# Patient Record
Sex: Male | Born: 1995 | Race: White | Hispanic: No | Marital: Single
Health system: Southern US, Community
[De-identification: ages and names within clinical notes are randomized; demographics above are authoritative.]

---

## 2019-10-22 ENCOUNTER — Ambulatory Visit: Payer: Self-pay | Admitting: Allergy and Immunology

## 2019-12-24 ENCOUNTER — Emergency Department (HOSPITAL_COMMUNITY)
Admission: EM | Admit: 2019-12-24 | Discharge: 2019-12-24 | Disposition: A | Discharge status: Home / Self Care | Payer: PRIVATE HEALTH INSURANCE | Attending: Emergency Medicine | Admitting: Emergency Medicine

## 2019-12-24 ENCOUNTER — Encounter (HOSPITAL_COMMUNITY): Payer: Self-pay | Admitting: Emergency Medicine

## 2019-12-24 ENCOUNTER — Other Ambulatory Visit: Payer: Self-pay

## 2019-12-24 ENCOUNTER — Emergency Department (HOSPITAL_COMMUNITY): Payer: PRIVATE HEALTH INSURANCE

## 2019-12-24 DIAGNOSIS — U071 COVID-19: Secondary | ICD-10-CM

## 2019-12-24 DIAGNOSIS — R0602 Shortness of breath: Secondary | ICD-10-CM

## 2019-12-24 DIAGNOSIS — J45909 Unspecified asthma, uncomplicated: Secondary | ICD-10-CM | POA: Diagnosis not present

## 2019-12-24 MED ORDER — BUDESONIDE 90 MCG/ACT IN AEPB: 1.0000 | INHALATION_SPRAY | Freq: Two times a day (BID) | RESPIRATORY_TRACT | 1 refills | Status: AC

## 2019-12-24 NOTE — ED Notes (Signed)
Patient verbalizes understanding of discharge instructions. Opportunity for questioning and answers were provided. Armband removed by staff, pt discharged from ED ambulatory to home.  

## 2019-12-24 NOTE — ED Provider Notes (Signed)
[facility] EMERGENCY DEPARTMENT Provider Note   CSN: [id number] Arrival date & time: 12/24/19  1135     History Chief Complaint  Patient presents with  . COVID+  . Shortness of Breath    Joshua Juarez is a 24 y.o. male.  Patient presents with concerns for hypoxia secondary to COVID-19 infection. Patient does have a history of asthma. But no other significant risk factors. He first became symptomatic with COVID-19 infection on Monday a week ago and on Thursday had a positive test. On Sunday he received monoclonal antibody. He has been seen at several facilities recently. Sunday at [facility] yesterday at [place]. Patient reports low oxygen saturations at home as low as 83% more so in the morning. Patient's main symptoms are shortness of breath and cough. Patient was not vaccinated.        History reviewed. No pertinent past medical history.  There are no problems to display for this patient.        History reviewed. No pertinent family history.  Social History   Tobacco Use  . Smoking status: Not on file  Substance Use Topics  . Alcohol use: Not on file  . Drug use: Not on file    Home Medications Prior to Admission medications   Not on File    Allergies    Patient has no known allergies.  Review of Systems   Review of Systems  Constitutional: Negative for chills and fever.  HENT: Negative for rhinorrhea and sore throat.   Eyes: Negative for visual disturbance.  Respiratory: Positive for cough and shortness of breath.   Cardiovascular: Negative for chest pain and leg swelling.  Gastrointestinal: Negative for abdominal pain, diarrhea, nausea and vomiting.  Genitourinary: Negative for dysuria.  Musculoskeletal: Negative for back pain and neck pain.  Skin: Negative for rash.  Neurological: Negative for dizziness, light-headedness and headaches.  Hematological: Does not bruise/bleed easily.  Psychiatric/Behavioral: Negative for  confusion.    Physical Exam Updated Vital Signs BP (!) 141/88   Pulse 70   Temp 97.9 F (36.6 C) (Oral)   Resp (!) 24   SpO2 96%   Physical Exam Vitals and nursing note reviewed.  Constitutional:      General: He is not in acute distress.    Appearance: Normal appearance. He is well-developed. He is not toxic-appearing or diaphoretic.  HENT:     Head: Normocephalic and atraumatic.  Eyes:     Conjunctiva/sclera: Conjunctivae normal.     Pupils: Pupils are equal, round, and reactive to light.  Cardiovascular:     Rate and Rhythm: Normal rate and regular rhythm.     Heart sounds: No murmur heard.   Pulmonary:     Effort: Pulmonary effort is normal. No respiratory distress.     Breath sounds: Normal breath sounds. No wheezing.  Abdominal:     Palpations: Abdomen is soft.     Tenderness: There is no abdominal tenderness.  Musculoskeletal:        General: Normal range of motion.     Cervical back: Normal range of motion and neck supple.  Skin:    General: Skin is warm and dry.  Neurological:     General: No focal deficit present.     Mental Status: He is alert and oriented to person, place, and time.     Cranial Nerves: No cranial nerve deficit.     Sensory: No sensory deficit.     Motor: No weakness.     ED  Results / Procedures / Treatments   Labs (all labs ordered are listed, but only abnormal results are displayed) Labs Reviewed - No data to display  EKG None  Radiology DG Chest 2 View  Result Date: 12/24/2019 CLINICAL DATA:  Shortness of breath with decreased oxygen saturation. COVID-19 positive EXAM: CHEST - 2 VIEW COMPARISON:  None. FINDINGS: There is ill-defined airspace opacity in both lower lung regions and in the left mid lung. Heart size and pulmonary vascularity are normal. No adenopathy. No bone lesions. IMPRESSION: Areas of patchy airspace opacity in the left mid lung and both lung bases consistent with atypical organism pneumonia. Heart size normal.  No adenopathy. Electronically Signed   By: Bretta Bang III M.D.   On: 12/24/2019 12:47    Procedures Procedures (including critical care time)  Medications Ordered in ED Medications - No data to display  ED Course  I have reviewed the triage vital signs and the nursing notes.  Pertinent labs & imaging results that were available during my care of the patient were reviewed by me and considered in my medical decision making (see chart for details).    MDM Rules/Calculators/A&P                          Patient with known COVID-19 infection. Chest x-ray today showing some early changes consistent with COVID-19 pneumonia. Patient symptoms started last Monday. Is a little over a week. Had positive Covid test on Thursday. On Sunday he received the monoclonal antibody. Past medical history significant for asthma he is continuing to use his albuterol inhaler. Patient states that at home he has been getting pulse ox readings as low as 83%. Patient has been seen at several places over the last few days. On Sunday he was seen at [facility]. Was at [facility] yesterday.  She has saturations here in the room at rest are at the lowest 94% at the highest 99%. With exercise oxygen saturations actually go up. We did walk the patient in the room and oxygen sats got as high as 99%. Patient nontoxic no acute distress.  He does state that he does feel little bit better today. Perhaps the monoclonal antibody is having some benefits.  Feel that patient would be a good candidate for Pulmicort. He can take this along with his albuterol inhaler. Also recommended over-the-counter multivitamin vitamin D and zinc supplements.  Patient given precautions to return for worse breathing problems or increased shortness of breath.   Final Clinical Impression(s) / ED Diagnoses Final diagnoses:  SOB (shortness of breath)  COVID-19    Rx / DC Orders ED Discharge Orders    None       Vanetta Mulders, MD 12/24/19 1619

## 2019-12-24 NOTE — ED Triage Notes (Signed)
Patient arrives to ED with complaints of low saturation and shortness of breath since his COVID dx on 10/7. Pt was seen at [facility] and [facility] this week and d/c from the ED. Pt states he's received COVID outpatient infusion but his O2 saturation in the mornings are in the upper 80's but ambulatory O2 is 93%. Pt states he is still generally weak, SOB, and is worried about his O2 levels.

## 2019-12-24 NOTE — Discharge Instructions (Signed)
Oxygen saturations here today are excellent. Chest x-ray is starting to show some changes of COVID-19 pneumonia which is more of an inflammatory change than an infectious change. Continue use your albuterol inhaler. At least 2 puffs every 6 hours. Start the new medicine Pulmicort 1 puff twice a day for 7 days. Make an appointment to follow-up with your doctors. Return for worsening shortness of breath or increased trouble breathing. Also recommend over-the-counter multivitamin zinc supplement and vitamin D supplement. Continue to hydrate yourself well. Rest. Work school note provided to be out for another 7 days.

## 2019-12-24 NOTE — ED Notes (Signed)
NP from [facility] called to give report on this patient that was sent to ED.  Pt COVID +, has been seen in ED several times this week, was at [facility] yesterday and sat in lobby with oxygen sats in the 80's.   NAD noted in lobby.

## 2022-05-10 IMAGING — DX DG CHEST 2V
2 series · 2 of 2 positions shown · non-contrast
Comparison: None.

CLINICAL DATA: Shortness of breath with decreased oxygen
saturation. [id number] positive

EXAM:
CHEST - 2 VIEW

[chest pa]
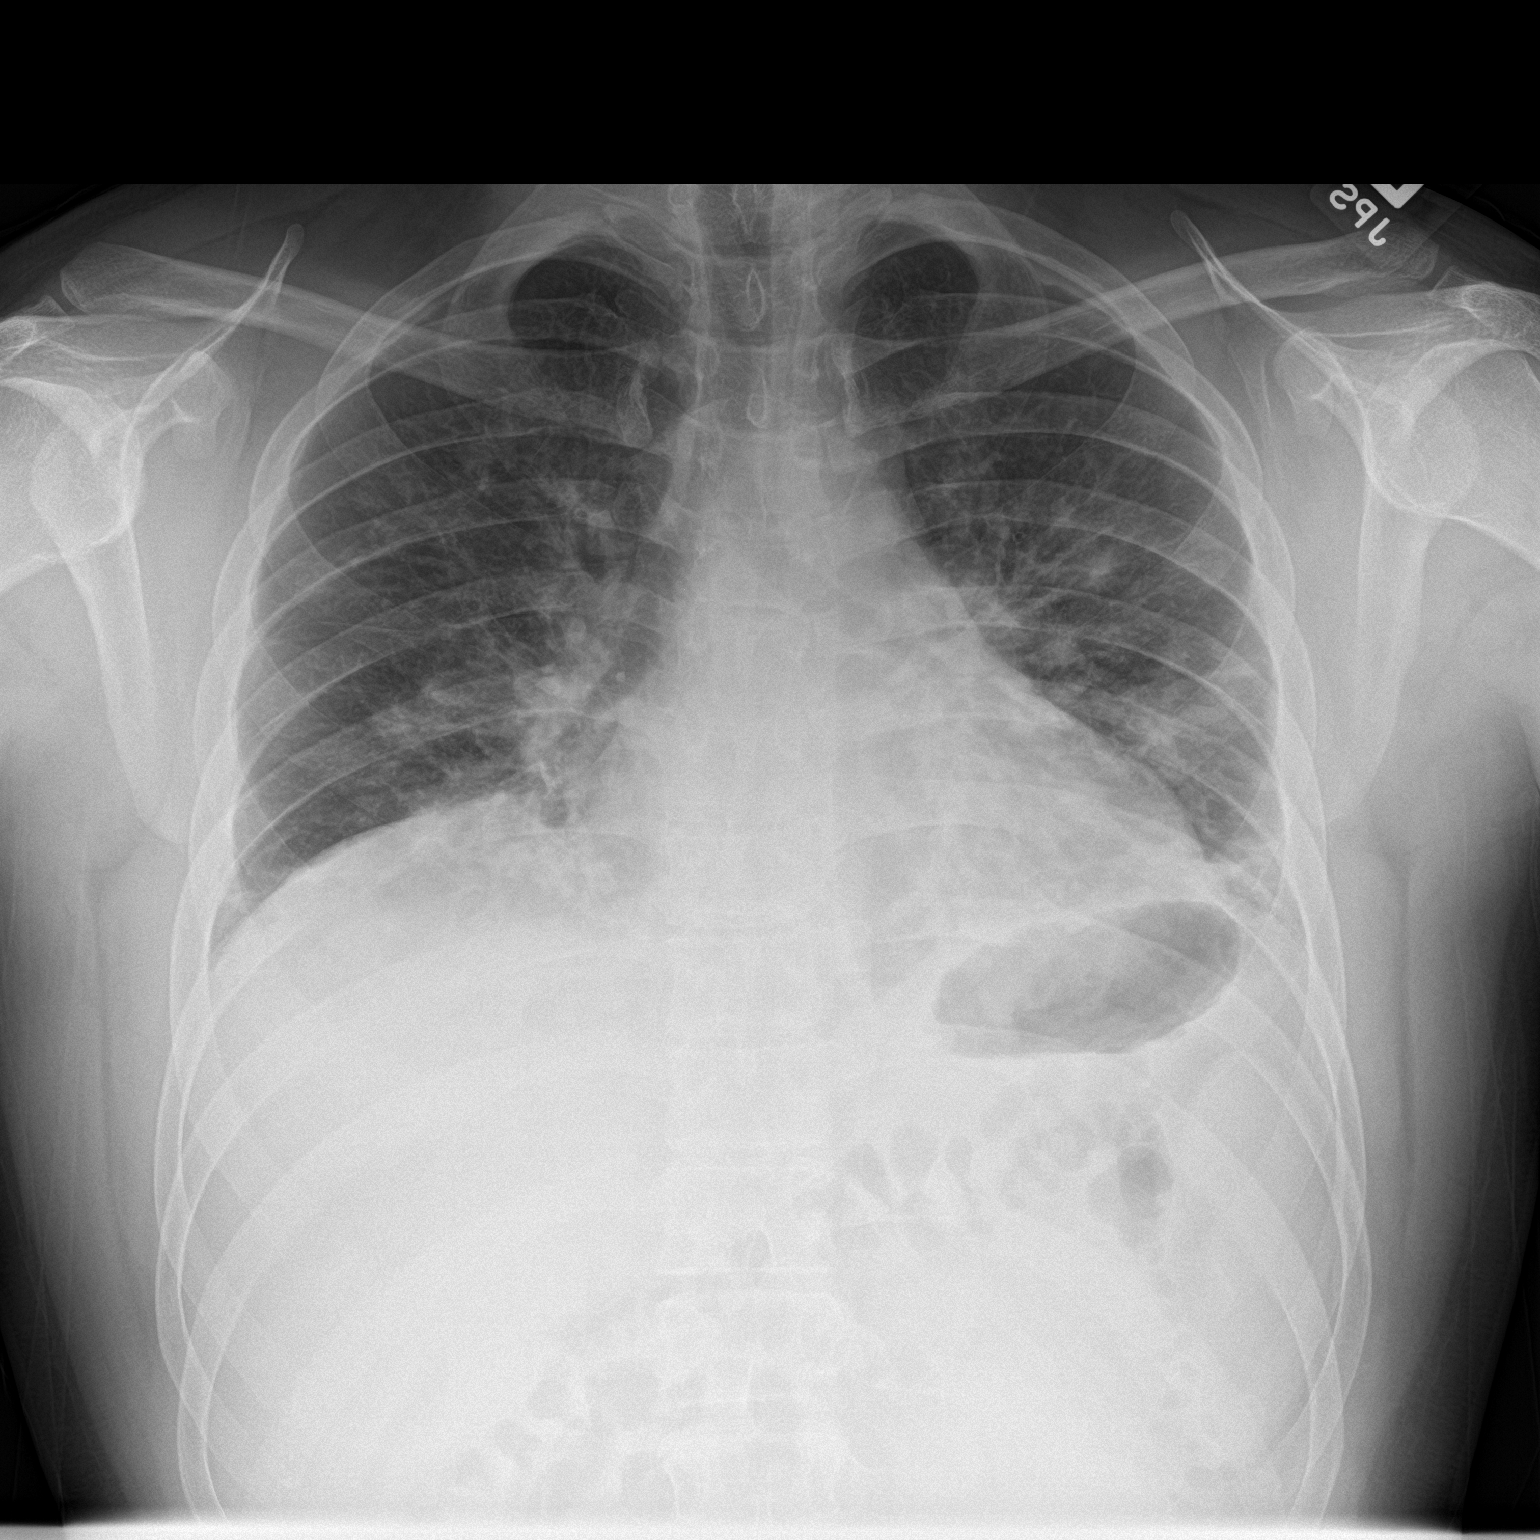

[chest lat]
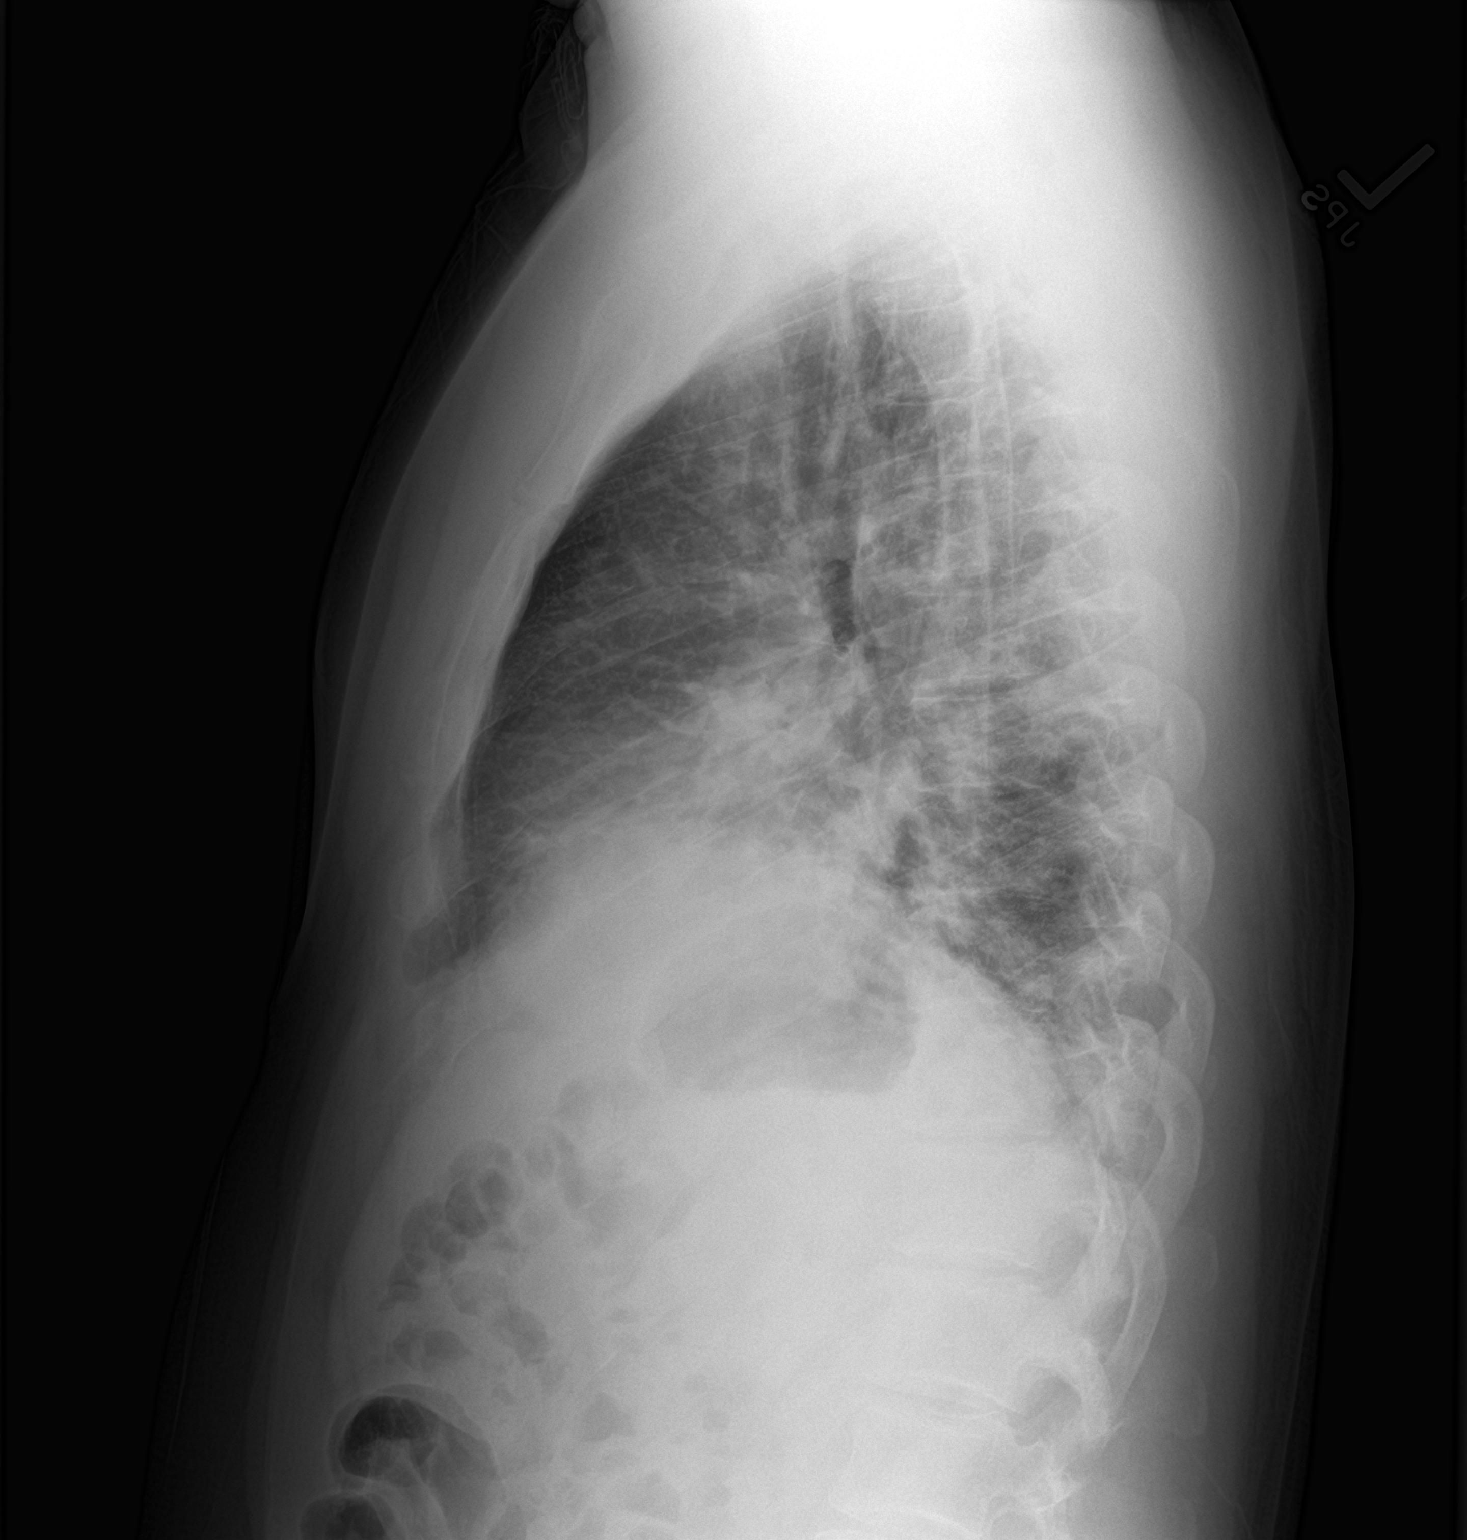

[2 of 2 positions shown; findings below may reference images not displayed]

FINDINGS: There is ill-defined airspace opacity in both lower lung regions and
in the left mid lung. Heart size and pulmonary vascularity are
normal. No adenopathy. No bone lesions.
IMPRESSION: Areas of patchy airspace opacity in the left mid lung and both lung
bases consistent with atypical organism pneumonia. Heart size
normal. No adenopathy.
